# Patient Record
Sex: Male | Born: 1985 | Race: White | Hispanic: No | Marital: Single | Smoking: Current some day smoker
Health system: Southern US, Community
[De-identification: ages and names within clinical notes are randomized; demographics above are authoritative.]

---

## 2010-07-23 ENCOUNTER — Emergency Department: Payer: Self-pay | Admitting: Emergency Medicine

## 2011-10-17 ENCOUNTER — Emergency Department: Payer: Self-pay | Admitting: Emergency Medicine

## 2011-11-16 ENCOUNTER — Emergency Department: Payer: Self-pay | Admitting: Emergency Medicine
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
## 2021-05-26 ENCOUNTER — Other Ambulatory Visit: Payer: Self-pay

## 2021-05-26 ENCOUNTER — Emergency Department: Payer: Self-pay

## 2021-05-26 DIAGNOSIS — Z5321 Procedure and treatment not carried out due to patient leaving prior to being seen by health care provider: Secondary | ICD-10-CM | POA: Insufficient documentation

## 2021-05-26 DIAGNOSIS — S42002A Fracture of unspecified part of left clavicle, initial encounter for closed fracture: Secondary | ICD-10-CM | POA: Insufficient documentation

## 2021-05-26 DIAGNOSIS — S0990XA Unspecified injury of head, initial encounter: Secondary | ICD-10-CM | POA: Insufficient documentation

## 2021-05-26 MED ORDER — OXYCODONE HCL 5 MG PO TABS
5.0000 mg | ORAL_TABLET | Freq: Once | ORAL | Status: AC
  Administered 2021-05-26: 5 mg via ORAL
  Filled 2021-05-26: qty 1
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
## 2021-05-27 ENCOUNTER — Emergency Department
Admission: EM | Admit: 2021-05-27 | Discharge: 2021-05-27 | Disposition: A | Discharge status: Home / Self Care | Payer: Self-pay | Attending: Emergency Medicine | Admitting: Emergency Medicine

## 2022-01-02 ENCOUNTER — Other Ambulatory Visit: Payer: Self-pay

## 2022-01-02 ENCOUNTER — Emergency Department
Admission: EM | Admit: 2022-01-02 | Discharge: 2022-01-03 | Disposition: A | Discharge status: Other Acute Inpatient Hospital | Payer: Self-pay | Attending: Emergency Medicine | Admitting: Emergency Medicine

## 2022-01-02 DIAGNOSIS — Z20822 Contact with and (suspected) exposure to covid-19: Secondary | ICD-10-CM | POA: Insufficient documentation

## 2022-01-02 DIAGNOSIS — F1510 Other stimulant abuse, uncomplicated: Secondary | ICD-10-CM

## 2022-01-02 DIAGNOSIS — F209 Schizophrenia, unspecified: Secondary | ICD-10-CM

## 2022-01-02 DIAGNOSIS — Z046 Encounter for general psychiatric examination, requested by authority: Secondary | ICD-10-CM | POA: Insufficient documentation

## 2022-01-02 DIAGNOSIS — R45851 Suicidal ideations: Secondary | ICD-10-CM | POA: Insufficient documentation

## 2022-01-02 DIAGNOSIS — F4320 Adjustment disorder, unspecified: Secondary | ICD-10-CM | POA: Insufficient documentation

## 2022-01-02 DIAGNOSIS — R259 Unspecified abnormal involuntary movements: Secondary | ICD-10-CM | POA: Insufficient documentation

## 2022-01-02 LAB — CBC
HCT: 45.1 % (ref 39.0–52.0)
Hemoglobin: 15.3 g/dL (ref 13.0–17.0)
MCH: 29 pg (ref 26.0–34.0)
MCHC: 33.9 g/dL (ref 30.0–36.0)
MCV: 85.4 fL (ref 80.0–100.0)
Platelets: 300 10*3/uL (ref 150–400)
RBC: 5.28 MIL/uL (ref 4.22–5.81)
RDW: 12.9 % (ref 11.5–15.5)
WBC: 6.2 10*3/uL (ref 4.0–10.5)
nRBC: 0 % (ref 0.0–0.2)

## 2022-01-02 LAB — COMPREHENSIVE METABOLIC PANEL
ALT: 18 U/L (ref 0–44)
AST: 33 U/L (ref 15–41)
Albumin: 4.5 g/dL (ref 3.5–5.0)
Alkaline Phosphatase: 68 U/L (ref 38–126)
Anion gap: 8 (ref 5–15)
BUN: 25 mg/dL — ABNORMAL HIGH (ref 6–20)
CO2: 25 mmol/L (ref 22–32)
Calcium: 9.1 mg/dL (ref 8.9–10.3)
Chloride: 106 mmol/L (ref 98–111)
Creatinine, Ser: 1.11 mg/dL (ref 0.61–1.24)
GFR, Estimated: >60 mL/min (ref >60)
Glucose, Bld: 92 mg/dL (ref 70–99)
Potassium: 4.2 mmol/L (ref 3.5–5.1)
Sodium: 139 mmol/L (ref 135–145)
Total Bilirubin: 2.4 mg/dL — ABNORMAL HIGH (ref 0.3–1.2)
Total Protein: 7.5 g/dL (ref 6.5–8.1)

## 2022-01-02 LAB — URINE DRUG SCREEN, QUALITATIVE (ARMC ONLY)
Amphetamines, Ur Screen: POSITIVE — AB
Barbiturates, Ur Screen: NOT DETECTED
Benzodiazepine, Ur Scrn: NOT DETECTED
Cannabinoid 50 Ng, Ur ~~LOC~~: NOT DETECTED
Cocaine Metabolite,Ur ~~LOC~~: NOT DETECTED
MDMA (Ecstasy)Ur Screen: NOT DETECTED
Methadone Scn, Ur: NOT DETECTED
Opiate, Ur Screen: NOT DETECTED
Phencyclidine (PCP) Ur S: NOT DETECTED
Tricyclic, Ur Screen: NOT DETECTED

## 2022-01-02 LAB — RESP PANEL BY RT-PCR (FLU A&B, COVID) ARPGX2
Influenza A by PCR: NEGATIVE
Influenza B by PCR: NEGATIVE
SARS Coronavirus 2 by RT PCR: NEGATIVE

## 2022-01-02 LAB — ETHANOL: Alcohol, Ethyl (B): <10 mg/dL (ref <10)

## 2022-01-02 LAB — SALICYLATE LEVEL: Salicylate Lvl: <7 mg/dL — ABNORMAL LOW (ref 7.0–30.0)

## 2022-01-02 LAB — ACETAMINOPHEN LEVEL: Acetaminophen (Tylenol), Serum: <10 ug/mL — ABNORMAL LOW (ref 10–30)

## 2022-01-02 LAB — TROPONIN I (HIGH SENSITIVITY): Troponin I (High Sensitivity): 3 ng/L (ref <18)
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
## 2022-01-02 NOTE — ED Notes (Signed)
Patient Items:  Wallace Cullens?Orange tennis shoes [employer] t-shirt Gray/Red [employer] Socks 100$ Bill [employer]

## 2022-01-02 NOTE — BH Assessment (Signed)
Comprehensive Clinical Assessment (CCA) Note  01/02/2022 Brad ClanJeremy Corbitt [id number]  Chief Complaint:  Chief Complaint  Patient presents with   Suicidal   ivc   Visit Diagnosis: Adjustment Disorder   Brad Johns is a 36 year old male who presents to the ER after voicing SI with no specific plan, when he was taking to the jail. He was arrested for probation violation. While in booking, he said, if he loses his dogs he is going to end his life. The dogs are currently in the animal shelter. Patient further reports that he had the same thoughts in December but it was his dogs that kept him from following through with it. Patient states he has no support system, except his girlfriend but she lives in the home with her ex-husband and children. Patient denies HI and AV/H.  CCA Screening, Triage and Referral (STR)  Patient Reported Information How did you hear about us? No data recorded What Is the Reason for Your Visit/Call Today? Voice SI while in jail because he's afraid.  How Long Has This Been Causing You Problems? 1 wk - 1 month  What Do You Feel Would Help You the Most Today? Treatment for Depression or other mood problem   Have You Recently Had Any Thoughts About Hurting Yourself? Yes  Are You Planning to Commit Suicide/Harm Yourself At This time? No   Have you Recently Had Thoughts About Hurting Someone Karolee Ohslse? No  Are You Planning to Harm Someone at This Time? No  Explanation: No data recorded  Have You Used Any Alcohol or Drugs in the Past 24 Hours? No  How Long Ago Did You Use Drugs or Alcohol? No data recorded What Did You Use and How Much? No data recorded  Do You Currently Have a Therapist/Psychiatrist? No  Name of Therapist/Psychiatrist: No data recorded  Have You Been Recently Discharged From Any Office Practice or Programs? No  Explanation of Discharge From Practice/Program: No data recorded    CCA Screening Triage Referral Assessment Type of Contact:  Face-to-Face  Telemedicine Service Delivery:   Is this Initial or Reassessment? No data recorded Date Telepsych consult ordered in [facility]:  No data recorded Time Telepsych consult ordered in [facility]:  No data recorded [facility]  [facility]   Collateral Involvement: No data recorded  Does Patient Have a Court Appointed Legal Guardian? No data recorded Name and Contact of Legal Guardian: No data recorded If Minor and Not Living with Parent(s), Who has Custody? No data recorded Is CPS involved or ever been involved? Never  Is APS involved or ever been involved? Never   Patient Determined To Be At Risk for Harm To Self or Others Based on Review of Patient Reported Information or Presenting Complaint? No  Method: No data recorded Availability of Means: No data recorded Intent: No data recorded Notification Required: No data recorded Additional Information for Danger to Others Potential: No data recorded Additional Comments for Danger to Others Potential: No data recorded Are There Guns or Other Weapons in Your Home? No data recorded Types of Guns/Weapons: No data recorded Are These Weapons Safely Secured?                            No data recorded Who Could Verify You Are Able To Have These Secured: No data recorded Do You Have any Outstanding Charges, Pending Court Dates, Parole/Probation? No data recorded Contacted To Inform of Risk of Harm  To Self or Others: No data recorded   Does Patient Present under Involuntary Commitment? Yes  IVC Papers Initial File Date: 01/02/22   [place] of Residence: [place]   Patient Currently Receiving the Following Services: Not Receiving Services   Determination of Need: Emergent (2 hours)   Options For Referral: ED Visit     CCA Biopsychosocial Patient Reported Schizophrenia/Schizoaffective Diagnosis in Past: No   Strengths: Some insight   Mental Health Symptoms Depression:   Tearfulness;  Hopelessness   Duration of Depressive symptoms:  Duration of Depressive Symptoms: Less than two weeks   Mania:   N/A   Anxiety:    N/A   Psychosis:   None   Duration of Psychotic symptoms:    Trauma:   N/A   Obsessions:   N/A   Compulsions:   N/A   Inattention:   N/A   Hyperactivity/Impulsivity:   N/A   Oppositional/Defiant Behaviors:   N/A   Emotional Irregularity:   N/A   Other Mood/Personality Symptoms:  No data recorded   Mental Status Exam Appearance and self-care  Stature:   Average   Weight:   Average weight   Clothing:   Neat/clean; Age-appropriate   Grooming:   Normal   Cosmetic use:   None   Posture/gait:   Normal   Motor activity:   -- (Within normal range)   Sensorium  Attention:   Normal   Concentration:   Normal   Orientation:   X5   Recall/memory:   Normal   Affect and Mood  Affect:   Appropriate   Mood:   Depressed   Relating  Eye contact:   Staring   Facial expression:   Responsive   Attitude toward examiner:   Cooperative   Thought and Language  Speech flow:  Clear and Coherent   Thought content:   Appropriate to Mood and Circumstances   Preoccupation:   None   Hallucinations:   None   Organization:  No data recorded  [employer] of Knowledge:   Fair   Intelligence:   Average   Abstraction:   Normal   Judgement:   Fair   Dance movement psychotherapist:   Adequate   Insight:   Fair   Decision Making:   Normal   Social Functioning  Social Maturity:   Responsible   Social Judgement:   Normal; "Garment/textile technologist   Stress  Stressors:   Relationship; Legal   Coping Ability:   Normal   Skill Deficits:   None   Supports:   Support needed     Religion: Religion/Spirituality Are You A Religious Person?: No  Leisure/Recreation: Leisure / Recreation Do You Have Hobbies?: No  Exercise/Diet: Exercise/Diet Do You Exercise?: No Have You Gained or Lost A  Significant Amount of Weight in the Past Six Months?: No Do You Follow a Special Diet?: No Do You Have Any Trouble Sleeping?: No   CCA Employment/Education Employment/Work Situation: Employment / Work Situation Employment Situation: Unemployed Patient's Job has Been Impacted by Current Illness: No Has Patient ever Been in Equities trader?: No  Education: Education Is Patient Currently Attending School?: No Did Theme park manager?: No Did You Have An Individualized Education Program (IIEP): No Did You Have Any Difficulty At [employer]?: No Patient's Education Has Been Impacted by Current Illness: No   CCA Family/Childhood History Family and Relationship History: Family history Marital status: Single Does patient have children?: Yes How many children?: 2 How is patient's relationship with their children?: Strained  due the mother no allowing him to see them.  Childhood History:  Childhood History By whom was/is the patient raised?: Both parents Did patient suffer any verbal/emotional/physical/sexual abuse as a child?: No Did patient suffer from severe childhood neglect?: No Has patient ever been sexually abused/assaulted/raped as an adolescent or adult?: No Was the patient ever a victim of a crime or a disaster?: No Witnessed domestic violence?: No Has patient been affected by domestic violence as an adult?: No  Child/Adolescent Assessment:     CCA Substance Use Alcohol/Drug Use: Alcohol / Drug Use Pain Medications: See PTA Prescriptions: See PTA Over the Counter: See PTA History of alcohol / drug use?: No history of alcohol / drug abuse Longest period of sobriety (when/how long): n/a  ASAM's:  Six Dimensions of Multidimensional Assessment  Dimension 1:  Acute Intoxication and/or Withdrawal Potential:      Dimension 2:  Biomedical Conditions and Complications:      Dimension 3:  Emotional, Behavioral, or Cognitive Conditions and Complications:     Dimension 4:   Readiness to Change:     Dimension 5:  Relapse, Continued use, or Continued Problem Potential:     Dimension 6:  Recovery/Living Environment:     ASAM Severity Score:    ASAM Recommended Level of Treatment:     Substance use Disorder (SUD)    Recommendations for Services/Supports/Treatments:    Discharge Disposition:    DSM5 Diagnoses: There are no problems to display for this patient.   Referrals to Alternative Service(s): Referred to Alternative Service(s):   Place:   Date:   Time:    Referred to Alternative Service(s):   Place:   Date:   Time:    Referred to Alternative Service(s):   Place:   Date:   Time:    Referred to Alternative Service(s):   Place:   Date:   Time:     Lilyan Gilford MS, LCAS, [facility], [facility] Therapeutic Triage Specialist 01/02/2022 7:12 PM

## 2022-01-02 NOTE — ED Provider Notes (Signed)
[facility] Provider Note    Event Date/Time   First MD Initiated Contact with Patient 01/02/22 1714     (approximate)   History   Chief Complaint: Suicidal and ivc   HPI  Brad Johns is a 36 y.o. male who is brought to the ED under involuntary commitment by law enforcement due to expressing intent to kill himself because police took his pet dogs away and placed them in care of animal control.  He denies any current pain or injuries.  Reports he is in a complicated situation where his girlfriend is currently living with her children and her ex-husband, so he is not able to stay with her and is remaining homeless.  No HI, no VAH     Physical Exam   Triage Vital Signs: ED Triage Vitals  Enc Vitals Group     BP 01/02/22 1642 (!) 123/94     Pulse Rate 01/02/22 1642 (!) 104     Resp 01/02/22 1642 19     Temp 01/02/22 1642 98 F (36.7 C)     Temp Source 01/02/22 1642 Oral     SpO2 01/02/22 1642 99 %     Weight 01/02/22 1648 175 lb (79.4 kg)     Height 01/02/22 1648 5\' 6"  (1.676 m)     Head Circumference --      Peak Flow --      Pain Score 01/02/22 1647 7     Pain Loc --      Pain Edu? --      Excl. in GC? --     Most recent vital signs: Vitals:   01/02/22 1642  BP: (!) 123/94  Pulse: (!) 104  Resp: 19  Temp: 98 F (36.7 C)  SpO2: 99%    General: Awake, no distress.  CV:  Good peripheral perfusion.  Resp:  Normal effort.  Abd:  No distention.   ED Results / Procedures / Treatments   Labs (all labs ordered are listed, but only abnormal results are displayed) Labs Reviewed  COMPREHENSIVE METABOLIC PANEL - Abnormal; Notable for the following components:      Result Value   BUN 25 (*)    Total Bilirubin 2.4 (*)    All other components within normal limits  CBC  ETHANOL  SALICYLATE LEVEL  ACETAMINOPHEN LEVEL  URINE DRUG SCREEN, QUALITATIVE ([facility] ONLY)  TROPONIN I (HIGH SENSITIVITY)      EKG    RADIOLOGY    PROCEDURES:  Procedures   MEDICATIONS ORDERED IN ED: Medications - No data to display   IMPRESSION / MDM / ASSESSMENT AND PLAN / ED COURSE  I reviewed the triage vital signs and the nursing notes.                              Differential diagnosis includes, but is not limited to, suicidal ideation, adjustment disorder, major depression, stress/frustration  Patient's presentation is most consistent with acute presentation with potential threat to life or bodily function.  Patient brought to the ED for expressing suicidal intent in the setting of homelessness and patient's pet animals being removed from him.  He has no acute medical complaints, medically stable.  The patient has been placed in psychiatric observation due to the need to provide a safe environment for the patient while obtaining psychiatric consultation and evaluation, as well as ongoing medical and medication management to treat the patient's condition.  The  patient has been placed under full IVC at this time.        FINAL CLINICAL IMPRESSION(S) / ED DIAGNOSES   Final diagnoses:  Suicidal ideation     Rx / DC Orders   ED Discharge Orders     None        Note:  This document was prepared using Dragon voice recognition software and may include unintentional dictation errors.   Sharman Cheek, MD 01/02/22 [phone]

## 2022-01-02 NOTE — ED Notes (Addendum)
This tech attempted vitals on pt. Writer attempted to wake pt x3, pt would not awaken. Vitals not obtained at this time. Will attempt at a later time when pt is awake.

## 2022-01-02 NOTE — ED Notes (Signed)
Pt eating [place] sandwich tray with water

## 2022-01-02 NOTE — ED Triage Notes (Signed)
Pt states that he is homeless pt reports seeing a psychiatrist when he was younger with separation anxiety and depression, pt unaware if he has a diagnosis now. Pt's ivc paperwork states he said he would commit suicide if his dog's were taken away. Pt is making crying sounds and song sounds in triage. 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

## 2022-01-03 ENCOUNTER — Inpatient Hospital Stay
Admission: AD | Admit: 2022-01-03 | Discharge: 2022-01-06 | DRG: 885 | Disposition: A | Discharge status: Home / Self Care | Payer: 59 | Source: Intra-hospital | Attending: Psychiatry | Admitting: Psychiatry

## 2022-01-03 DIAGNOSIS — F203 Undifferentiated schizophrenia: Secondary | ICD-10-CM | POA: Diagnosis not present

## 2022-01-03 DIAGNOSIS — F15151 Other stimulant abuse with stimulant-induced psychotic disorder with hallucinations: Secondary | ICD-10-CM | POA: Diagnosis present

## 2022-01-03 DIAGNOSIS — R45851 Suicidal ideations: Secondary | ICD-10-CM | POA: Diagnosis present

## 2022-01-03 DIAGNOSIS — F209 Schizophrenia, unspecified: Principal | ICD-10-CM | POA: Diagnosis present

## 2022-01-03 DIAGNOSIS — F32A Depression, unspecified: Secondary | ICD-10-CM | POA: Diagnosis present

## 2022-01-03 DIAGNOSIS — Z5900 Homelessness unspecified: Secondary | ICD-10-CM

## 2022-01-03 DIAGNOSIS — F15951 Other stimulant use, unspecified with stimulant-induced psychotic disorder with hallucinations: Principal | ICD-10-CM

## 2022-01-03 DIAGNOSIS — F1510 Other stimulant abuse, uncomplicated: Secondary | ICD-10-CM

## 2022-01-03 MED ORDER — HYDROXYZINE HCL 50 MG PO TABS: 50.0000 mg | ORAL_TABLET | Freq: Three times a day (TID) | ORAL | Status: DC | PRN

## 2022-01-03 MED ORDER — ACETAMINOPHEN 325 MG PO TABS
650.0000 mg | ORAL_TABLET | Freq: Four times a day (QID) | ORAL | Status: DC | PRN
  Administered 2022-01-04: 650 mg via ORAL
  Filled 2022-01-03: qty 2

## 2022-01-03 MED ORDER — HALOPERIDOL 1 MG PO TABS
2.0000 mg | ORAL_TABLET | Freq: Two times a day (BID) | ORAL | Status: DC
  Administered 2022-01-04 – 2022-01-06 (×5): 2 mg via ORAL
  Filled 2022-01-03 (×5): qty 2

## 2022-01-03 MED ORDER — NICOTINE 21 MG/24HR TD PT24
21.0000 mg | MEDICATED_PATCH | Freq: Every day | TRANSDERMAL | Status: DC
  Administered 2022-01-04: 21 mg via TRANSDERMAL
  Filled 2022-01-03 (×3): qty 1

## 2022-01-03 MED ORDER — ALUM & MAG HYDROXIDE-SIMETH 200-200-20 MG/5ML PO SUSP: 30.0000 mL | ORAL | Status: DC | PRN

## 2022-01-03 MED ORDER — HALOPERIDOL 2 MG PO TABS
2.0000 mg | ORAL_TABLET | Freq: Two times a day (BID) | ORAL | Status: DC
  Administered 2022-01-03 (×2): 2 mg via ORAL
  Filled 2022-01-03 (×2): qty 1

## 2022-01-03 MED ORDER — TRAZODONE HCL 100 MG PO TABS: 100.0000 mg | ORAL_TABLET | Freq: Every evening | ORAL | Status: DC | PRN

## 2022-01-03 MED ORDER — MAGNESIUM HYDROXIDE 400 MG/5ML PO SUSP: 30.0000 mL | Freq: Every day | ORAL | Status: DC | PRN

## 2022-01-03 NOTE — ED Notes (Signed)
Report called and given to [facility], [place].

## 2022-01-03 NOTE — ED Notes (Signed)
IVC/pending inpatient psych admit  

## 2022-01-03 NOTE — ED Notes (Signed)
Patient ate100% of supper .

## 2022-01-03 NOTE — ED Notes (Signed)
Patient is cooperative, and calm, He is sitting up now eating lunch. Will continue to monitor.

## 2022-01-03 NOTE — Consult Note (Signed)
[facility]   Reason for Consult: Brief consult.  May note follow on admission.  36 year old man brought to the emergency room under IVC now reporting suicidal ideation and hallucinations Referring Physician: Cyril LoosenKinner Patient Identification: Brad ClanJeremy Apo MRN:  [id number] Principal Diagnosis: Schizophrenia (HCC) Diagnosis:  Principal Problem:   Schizophrenia (HCC) Active Problems:   Amphetamine abuse (HCC)   Total Time spent with patient: 45 minutes  Subjective:   Brad Johns is a 36 y.o. male patient admitted with I wanted to kill myself".  HPI: Reports he was just evicted from his [place] and violated on his probation and has been hearing voices and very depressed.  Thinking of suicide.  Using amphetamines.  Past Psychiatric History: Somewhat unclear.  Apparently has had past suicide attempts unclear if he is ever been actually treated for any mental illness  Risk to Self:   Risk to Others:   Prior Inpatient Therapy:   Prior Outpatient Therapy:    Past Medical History: No past medical history on file. No past surgical history on file. Family History: No family history on file. Family Psychiatric  History: An uncle with schizophrenia Social History:  Social History   Substance and Sexual Activity  Alcohol Use Not Currently     Social History   Substance and Sexual Activity  Drug Use Not on file    Social History   Socioeconomic History   Marital status: Single    Spouse name: Not on file   Number of children: Not on file   Years of education: Not on file   Highest education level: Not on file  Occupational History   Not on file  Tobacco Use   Smoking status: Some Days    Types: Cigarettes   Smokeless tobacco: Not on file  Substance and Sexual Activity   Alcohol use: Not Currently   Drug use: Not on file   Sexual activity: Not on file  Other Topics Concern   Not on file  Social History Narrative   Not on file   Social Determinants  of Health   Financial Resource Strain: Not on file  Food Insecurity: Not on file  Transportation Needs: Not on file  Physical Activity: Not on file  Stress: Not on file  Social Connections: Not on file   Additional Social History:    Allergies:  No Known Allergies  Labs:  Results for orders placed or performed during the hospital encounter of 01/02/22 (from the past 48 hour(s))  Comprehensive metabolic panel     Status: Abnormal   Collection Time: 01/02/22  4:43 PM  Result Value Ref Range   Sodium 139 135 - 145 mmol/L   Potassium 4.2 3.5 - 5.1 mmol/L   Chloride 106 98 - 111 mmol/L   CO2 25 22 - 32 mmol/L   Glucose, Bld 92 70 - 99 mg/dL    Comment: Glucose reference range applies only to samples taken after fasting for at least 8 hours.   BUN 25 (H) 6 - 20 mg/dL   Creatinine, Ser 4.091.11 0.61 - 1.24 mg/dL   Calcium 9.1 8.9 - 81.110.3 mg/dL   Total Protein 7.5 6.5 - 8.1 g/dL   Albumin 4.5 3.5 - 5.0 g/dL   AST 33 15 - 41 U/L   ALT 18 0 - 44 U/L   Alkaline Phosphatase 68 38 - 126 U/L   Total Bilirubin 2.4 (H) 0.3 - 1.2 mg/dL   GFR, Estimated >91>60 >47>60 mL/min    Comment: (NOTE) Calculated  using the CKD-EPI Creatinine Equation (2021)    Anion gap 8 5 - 15    Comment: Performed at [facility], [address] [place], [place] [address]  Ethanol     Status: None   Collection Time: 01/02/22  4:43 PM  Result Value Ref Range   Alcohol, Ethyl (B) <10 <10 mg/dL    Comment: (NOTE) Lowest detectable limit for serum alcohol is 10 mg/dL.  For medical purposes only. Performed at [facility], [address] [place], [place] [address]   Salicylate level     Status: Abnormal   Collection Time: 01/02/22  4:43 PM  Result Value Ref Range   Salicylate Lvl <7.0 (L) 7.0 - 30.0 mg/dL    Comment: Performed at [facility], [address] [place], [place] [address]  Acetaminophen level     Status: Abnormal   Collection Time: 01/02/22  4:43 PM  Result Value Ref  Range   Acetaminophen (Tylenol), Serum <10 (L) 10 - 30 ug/mL    Comment: (NOTE) Therapeutic concentrations vary significantly. A range of 10-30 ug/mL  may be an effective concentration for many patients. However, some  are best treated at concentrations outside of this range. Acetaminophen concentrations >150 ug/mL at 4 hours after ingestion  and >50 ug/mL at 12 hours after ingestion are often associated with  toxic reactions.  Performed at [facility], [address] [place], [place] [address]   cbc     Status: None   Collection Time: 01/02/22  4:43 PM  Result Value Ref Range   WBC 6.2 4.0 - 10.5 K/uL   RBC 5.28 4.22 - 5.81 MIL/uL   Hemoglobin 15.3 13.0 - 17.0 g/dL   HCT 87.5 64.3 - 32.9 %   MCV 85.4 80.0 - 100.0 fL   MCH 29.0 26.0 - 34.0 pg   MCHC 33.9 30.0 - 36.0 g/dL   RDW 51.8 84.1 - 66.0 %   Platelets 300 150 - 400 K/uL   nRBC 0.0 0.0 - 0.2 %    Comment: Performed at [facility], [address], [place], [place] [address]  Troponin I (High Sensitivity)     Status: None   Collection Time: 01/02/22  4:43 PM  Result Value Ref Range   Troponin I (High Sensitivity) 3 <18 ng/L    Comment: (NOTE) Elevated high sensitivity troponin I (hsTnI) values and significant  changes across serial measurements may suggest ACS but many other  chronic and acute conditions are known to elevate hsTnI results.  Refer to the "Links" section for chest pain algorithms and additional  guidance. Performed at [facility], [address] [place], [place] [address]   Urine Drug Screen, Qualitative     Status: Abnormal   Collection Time: 01/02/22  4:50 PM  Result Value Ref Range   Tricyclic, Ur Screen NONE DETECTED NONE DETECTED   Amphetamines, Ur Screen POSITIVE (A) NONE DETECTED   MDMA (Ecstasy)Ur Screen NONE DETECTED NONE DETECTED   Cocaine Metabolite,Ur [place] NONE DETECTED NONE DETECTED   Opiate, Ur Screen NONE DETECTED NONE DETECTED   Phencyclidine (PCP) Ur S  NONE DETECTED NONE DETECTED   Cannabinoid 50 Ng, Ur [place] NONE DETECTED NONE DETECTED   Barbiturates, Ur Screen NONE DETECTED NONE DETECTED   Benzodiazepine, Ur Scrn NONE DETECTED NONE DETECTED   Methadone Scn, Ur NONE DETECTED NONE DETECTED    Comment: (NOTE) Tricyclics + metabolites, urine    Cutoff 1000 ng/mL Amphetamines + metabolites, urine  Cutoff  1000 ng/mL MDMA (Ecstasy), urine              Cutoff 500 ng/mL Cocaine Metabolite, urine          Cutoff 300 ng/mL Opiate + metabolites, urine        Cutoff 300 ng/mL Phencyclidine (PCP), urine         Cutoff 25 ng/mL Cannabinoid, urine                 Cutoff 50 ng/mL Barbiturates + metabolites, urine  Cutoff 200 ng/mL Benzodiazepine, urine              Cutoff 200 ng/mL Methadone, urine                   Cutoff 300 ng/mL  The urine drug screen provides only a preliminary, unconfirmed analytical test result and should not be used for non-medical purposes. Clinical consideration and professional judgment should be applied to any positive drug screen result due to possible interfering substances. A more specific alternate chemical method must be used in order to obtain a confirmed analytical result. Gas chromatography / mass spectrometry (GC/MS) is the preferred confirm atory method. Performed at [facility], [address] [place], [place] [address]   Resp Panel by RT-PCR (Flu A&B, Covid) Anterior Nasal Swab     Status: None   Collection Time: 01/02/22  6:31 PM   Specimen: Anterior Nasal Swab  Result Value Ref Range   SARS Coronavirus 2 by RT PCR NEGATIVE NEGATIVE    Comment: (NOTE) SARS-CoV-2 target nucleic acids are NOT DETECTED.  The SARS-CoV-2 RNA is generally detectable in upper respiratory specimens during the acute phase of infection. The lowest concentration of SARS-CoV-2 viral copies this assay can detect is 138 copies/mL. A negative result does not preclude SARS-Cov-2 infection and should not be used as the  sole basis for treatment or other patient management decisions. A negative result may occur with  improper specimen collection/handling, submission of specimen other than nasopharyngeal swab, presence of viral mutation(s) within the areas targeted by this assay, and inadequate number of viral copies(<138 copies/mL). A negative result must be combined with clinical observations, patient history, and epidemiological information. The expected result is Negative.  Fact Sheet for Patients:  BloggerCourse.com  Fact Sheet for Healthcare Providers:  SeriousBroker.it  This test is no t yet approved or cleared by the [place] FDA and  has been authorized for detection and/or diagnosis of SARS-CoV-2 by FDA under an Emergency Use Authorization (EUA). This EUA will remain  in effect (meaning this test can be used) for the duration of the COVID-19 declaration under Section 564(b)(1) of the Act, 21 U.S.C.section 360bbb-3(b)(1), unless the authorization is terminated  or revoked sooner.       Influenza A by PCR NEGATIVE NEGATIVE   Influenza B by PCR NEGATIVE NEGATIVE    Comment: (NOTE) The Xpert Xpress SARS-CoV-2/FLU/RSV plus assay is intended as an aid in the diagnosis of influenza from Nasopharyngeal swab specimens and should not be used as a sole basis for treatment. Nasal washings and aspirates are unacceptable for Xpert Xpress SARS-CoV-2/FLU/RSV testing.  Fact Sheet for Patients: BloggerCourse.com  Fact Sheet for Healthcare Providers: SeriousBroker.it  This test is not yet approved or cleared by the [place] FDA and has been authorized for detection and/or diagnosis of SARS-CoV-2 by FDA under an Emergency Use Authorization (EUA). This EUA will remain in effect (meaning this test can be used) for the duration of the  COVID-19 declaration under Section 564(b)(1) of the Act, 21  U.S.C. section 360bbb-3(b)(1), unless the authorization is terminated or revoked.  Performed at [facility], [address] [place], [place] [address]     Current Facility-Administered Medications  Medication Dose Route Frequency Provider Last Rate Last Admin   haloperidol (HALDOL) tablet 2 mg  2 mg Oral BID Zyquan Crotty, Jackquline Denmark, MD       No current outpatient medications on file.    Musculoskeletal: Strength & Muscle Tone: within normal limits Gait & Station: normal Patient leans: N/A            Psychiatric Specialty Exam:  Presentation  General Appearance: No data recorded Eye Contact:No data recorded Speech:No data recorded Speech Volume:No data recorded Handedness:No data recorded  Mood and Affect  Mood:No data recorded Affect:No data recorded  Thought Process  Thought Processes:No data recorded Descriptions of Associations:No data recorded Orientation:No data recorded Thought Content:No data recorded History of Schizophrenia/Schizoaffective disorder:No  Duration of Psychotic Symptoms:No data recorded Hallucinations:No data recorded Ideas of Reference:No data recorded Suicidal Thoughts:No data recorded Homicidal Thoughts:No data recorded  Sensorium  Memory:No data recorded Judgment:No data recorded Insight:No data recorded  Executive Functions  Concentration:No data recorded Attention Span:No data recorded Recall:No data recorded Fund of Knowledge:No data recorded Language:No data recorded  Psychomotor Activity  Psychomotor Activity:No data recorded  Assets  Assets:No data recorded  Sleep  Sleep:No data recorded  Physical Exam: Physical Exam Vitals and nursing note reviewed.  Constitutional:      Appearance: Normal appearance.  HENT:     Head: Normocephalic and atraumatic.     Mouth/Throat:     Pharynx: Oropharynx is clear.  Eyes:     Pupils: Pupils are equal, round, and reactive to light.  Cardiovascular:     Rate  and Rhythm: Normal rate and regular rhythm.  Pulmonary:     Effort: Pulmonary effort is normal.     Breath sounds: Normal breath sounds.  Abdominal:     General: Abdomen is flat.     Palpations: Abdomen is soft.  Musculoskeletal:        General: Normal range of motion.  Skin:    General: Skin is warm and dry.  Neurological:     General: No focal deficit present.     Mental Status: He is alert. Mental status is at baseline.  Psychiatric:        Attention and Perception: He is inattentive. He perceives auditory hallucinations.        Mood and Affect: Mood is anxious and depressed. Affect is labile and inappropriate.        Speech: Speech is tangential.        Behavior: Behavior is agitated.        Thought Content: Thought content includes suicidal ideation.        Cognition and Memory: Cognition is impaired. Memory is impaired.        Judgment: Judgment is impulsive.   Review of Systems  Constitutional: Negative.   HENT: Negative.    Eyes: Negative.   Respiratory: Negative.    Cardiovascular: Negative.   Gastrointestinal: Negative.   Musculoskeletal: Negative.   Skin: Negative.   Neurological: Negative.   Psychiatric/Behavioral:  Positive for depression, hallucinations, substance abuse and suicidal ideas. The patient is nervous/anxious and has insomnia.   Blood pressure 96/66, pulse 78, temperature 98 F (36.7 C), temperature source Oral, resp. rate 16, height  (1.676 m), weight 79.4 kg, SpO2 99 %. Body mass  index is 28.25 kg/m.  Treatment Plan Summary: Medication management and Plan start antipsychotic medicine.  Continue IVC.  Recommend admission to the psychiatric unit  Disposition: Orders will be placed for admission  Mordecai Rasmussen, MD 01/03/2022 3:05 PM

## 2022-01-03 NOTE — ED Notes (Signed)
Pt given lunch tray and beverage 

## 2022-01-03 NOTE — ED Notes (Signed)
Pt given meal tray.

## 2022-01-03 NOTE — BH Assessment (Signed)
Patient is to be admitted to [facility] by Dr. Toni Amend.  Attending Physician will be Dr.  Toni Amend .   Patient has been assigned to room 320, by [facility] Charge Nurse [place].   Intake Paper Work has been signed and placed on patient chart.  ER staff is aware of the admission: Melody, ER Secretary   Dr. Toni Amend, ER MD  Alvis Lemmings, Patient's Nurse

## 2022-01-03 NOTE — ED Notes (Signed)
Pt taken to interview room with Dr. Weber Cooks

## 2022-01-03 NOTE — ED Notes (Signed)
Patient was given a meal tray and beverage

## 2022-01-04 ENCOUNTER — Other Ambulatory Visit: Payer: Self-pay

## 2022-01-04 ENCOUNTER — Encounter: Payer: Self-pay | Admitting: Psychiatry

## 2022-01-04 DIAGNOSIS — F203 Undifferentiated schizophrenia: Secondary | ICD-10-CM

## 2022-01-04 LAB — LIPID PANEL
Cholesterol: 161 mg/dL (ref 0–200)
HDL: 47 mg/dL (ref >40)
LDL Cholesterol: 101 mg/dL — ABNORMAL HIGH (ref 0–99)
Total CHOL/HDL Ratio: 3.4 RATIO
Triglycerides: 66 mg/dL (ref <150)
VLDL: 13 mg/dL (ref 0–40)

## 2022-01-04 LAB — HEMOGLOBIN A1C
Hgb A1c MFr Bld: 5.2 % (ref 4.8–5.6)
Mean Plasma Glucose: 102.54 mg/dL

## 2022-01-04 NOTE — Group Note (Signed)
LCSW Group Therapy Note  Group Date: 01/04/2022 Start Time: 1300 End Time: 1400   Type of Therapy and Topic:  Group Therapy - Healthy vs Unhealthy Coping Skills  Participation Level:  Did Not Attend   Description of Group The focus of this group was to determine what unhealthy coping techniques typically are used by group members and what healthy coping techniques would be helpful in coping with various problems. Patients were guided in becoming aware of the differences between healthy and unhealthy coping techniques. Patients were asked to identify 2-3 healthy coping skills they would like to learn to use more effectively.  Therapeutic Goals Patients learned that coping is what human beings do all day long to deal with various situations in their lives Patients defined and discussed healthy vs unhealthy coping techniques Patients identified their preferred coping techniques and identified whether these were healthy or unhealthy Patients determined 2-3 healthy coping skills they would like to become more familiar with and use more often. Patients provided support and ideas to each other   Summary of Patient Progress:Due to an influx of patients, group was not held on the unit.    Therapeutic Modalities Cognitive Behavioral Therapy Motivational Interviewing  Aizen Duval K Antion Andres, LCSWA 01/04/2022  3:35 PM   
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
## 2022-01-04 NOTE — Progress Notes (Signed)
[facility] MD Progress Note  01/04/2022 1:22 PM Brad Johns  MRN:  [id number] Subjective: Follow-up patient with mood symptoms and psychotic symptoms.  Patient is much calmer today but still somewhat grumpy.  He did get some rest.  He remains completely focused on wanting to get his dogs back.  He denies having any hallucinations today.  Denies suicidal thoughts. Principal Problem: Schizophrenia (HCC) Diagnosis: Principal Problem:   Schizophrenia (HCC)  Total Time spent with patient: 30 minutes  Past Psychiatric History: Past history unclear.  I still think there is a very good chance that he has schizophrenia although there may be some intellectual disability as well.  Certainly has had substance abuse issues at times.  Currently seems to have calm down the dangerousness but still not to have the best judgment  Past Medical History: History reviewed. No pertinent past medical history. History reviewed. No pertinent surgical history. Family History: History reviewed. No pertinent family history. Family Psychiatric  History: See previous Social History:  Social History   Substance and Sexual Activity  Alcohol Use Not Currently     Social History   Substance and Sexual Activity  Drug Use Not on file    Social History   Socioeconomic History   Marital status: Single    Spouse name: Not on file   Number of children: Not on file   Years of education: Not on file   Highest education level: Not on file  Occupational History   Not on file  Tobacco Use   Smoking status: Some Days    Types: Cigarettes   Smokeless tobacco: Not on file  Substance and Sexual Activity   Alcohol use: Not Currently   Drug use: Not on file   Sexual activity: Not on file  Other Topics Concern   Not on file  Social History Narrative   Not on file   Social Determinants of Health   Financial Resource Strain: Not on file  Food Insecurity: Not on file  Transportation Needs: Not on file  Physical Activity:  Not on file  Stress: Not on file  Social Connections: Not on file   Additional Social History:                         Sleep: Good  Appetite:  Fair  Current Medications: Current Facility-Administered Medications  Medication Dose Route Frequency Provider Last Rate Last Admin   acetaminophen (TYLENOL) tablet 650 mg  650 mg Oral Q6H PRN Omero Kowal, Jackquline Denmark, MD   650 mg at 01/04/22 0820   alum & mag hydroxide-simeth (MAALOX/MYLANTA) 200-200-20 MG/5ML suspension 30 mL  30 mL Oral Q4H PRN Katheren Jimmerson, Jackquline Denmark, MD       haloperidol (HALDOL) tablet 2 mg  2 mg Oral BID Colena Ketterman T, MD   2 mg at 01/04/22 0818   hydrOXYzine (ATARAX) tablet 50 mg  50 mg Oral TID PRN Brodie Scovell, Jackquline Denmark, MD       magnesium hydroxide (MILK OF MAGNESIA) suspension 30 mL  30 mL Oral Daily PRN Jerianne Anselmo T, MD       nicotine (NICODERM CQ - dosed in mg/24 hours) patch 21 mg  21 mg Transdermal Q0600 Ginger Leeth T, MD   21 mg at 01/04/22 0818   traZODone (DESYREL) tablet 100 mg  100 mg Oral QHS PRN Lolamae Voisin, Jackquline Denmark, MD        Lab Results:  Results for orders placed or performed during the hospital encounter of  01/03/22 (from the past 48 hour(s))  Lipid panel     Status: Abnormal   Collection Time: 01/04/22  6:15 AM  Result Value Ref Range   Cholesterol 161 0 - 200 mg/dL   Triglycerides 66 <409<150 mg/dL   HDL 47 >81>40 mg/dL   Total CHOL/HDL Ratio 3.4 RATIO   VLDL 13 0 - 40 mg/dL   LDL Cholesterol 191101 (H) 0 - 99 mg/dL    Comment:        Total Cholesterol/HDL:CHD Risk Coronary Heart Disease Risk Table                     Men   Women  1/2 Average Risk   3.4   3.3  Average Risk       5.0   4.4  2 X Average Risk   9.6   7.1  3 X Average Risk  23.4   11.0        Use the calculated Patient Ratio above and the CHD Risk Table to determine the patient's CHD Risk.        ATP III CLASSIFICATION (LDL):  <100     mg/dL   Optimal  [id number]  mg/dL   Near or Above                    Optimal  130-159  mg/dL   Borderline   [id number]  mg/dL   High  >657>190     mg/dL   Very High Performed at [facility], [address] [place], [place] [address]     Blood Alcohol level:  Lab Results  Component Value Date   [facility] 01/02/2022    Metabolic Disorder Labs: No results found for: HGBA1C, MPG No results found for: PROLACTIN Lab Results  Component Value Date   CHOL 161 01/04/2022   TRIG 66 01/04/2022   HDL 47 01/04/2022   CHOLHDL 3.4 01/04/2022   VLDL 13 01/04/2022   LDLCALC 101 (H) 01/04/2022    Physical Findings: AIMS:  , ,  ,  ,    CIWA:    COWS:     Musculoskeletal: Strength & Muscle Tone: within normal limits Gait & Station: normal Patient leans: N/A  Psychiatric Specialty Exam:  Presentation  General Appearance: No data recorded Eye Contact:No data recorded Speech:No data recorded Speech Volume:No data recorded Handedness:No data recorded  Mood and Affect  Mood:No data recorded Affect:No data recorded  Thought Process  Thought Processes:No data recorded Descriptions of Associations:No data recorded Orientation:No data recorded Thought Content:No data recorded History of Schizophrenia/Schizoaffective disorder:No  Duration of Psychotic Symptoms:No data recorded Hallucinations:No data recorded Ideas of Reference:No data recorded Suicidal Thoughts:No data recorded Homicidal Thoughts:No data recorded  Sensorium  Memory:No data recorded Judgment:No data recorded Insight:No data recorded  Executive Functions  Concentration:No data recorded Attention Span:No data recorded Recall:No data recorded Fund of Knowledge:No data recorded Language:No data recorded  Psychomotor Activity  Psychomotor Activity:No data recorded  Assets  Assets:No data recorded  Sleep  Sleep:No data recorded   Physical Exam: Physical Exam Vitals and nursing note reviewed.  Constitutional:      Appearance: Normal appearance.  HENT:     Head: Normocephalic and atraumatic.      Mouth/Throat:     Pharynx: Oropharynx is clear.  Eyes:     Pupils: Pupils are equal, round, and reactive to light.  Cardiovascular:     Rate and Rhythm: Normal rate and regular rhythm.  Pulmonary:  Effort: Pulmonary effort is normal.     Breath sounds: Normal breath sounds.  Abdominal:     General: Abdomen is flat.     Palpations: Abdomen is soft.  Musculoskeletal:        General: Normal range of motion.  Skin:    General: Skin is warm and dry.  Neurological:     General: No focal deficit present.     Mental Status: He is alert. Mental status is at baseline.  Psychiatric:        Attention and Perception: He is inattentive.        Mood and Affect: Mood normal. Affect is blunt.        Speech: Speech is delayed.        Behavior: Behavior is slowed.        Thought Content: Thought content normal.   Review of Systems  Constitutional: Negative.   HENT: Negative.    Eyes: Negative.   Respiratory: Negative.    Cardiovascular: Negative.   Gastrointestinal: Negative.   Musculoskeletal: Negative.   Skin: Negative.   Neurological: Negative.   Psychiatric/Behavioral:  Positive for depression. Negative for hallucinations and suicidal ideas.   Blood pressure 117/79, pulse 85, temperature 97.8 F (36.6 C), temperature source Oral, resp. rate 18, height 5\' 6"  (1.676 m), weight 73.5 kg, SpO2 98 %. Body mass index is 26.15 kg/m.   Treatment Plan Summary: Medication management and Plan no change to current medicine.  Encourage patient to be up out of bed and go outdoors interact with staff and social work.  Reassess tomorrow  , MD 01/04/2022, 1:22 PM

## 2022-01-04 NOTE — Plan of Care (Signed)
Patient newly admitted and he requires management and stabilization of psychiatric symptoms.

## 2022-01-04 NOTE — [facility] Counselor (Signed)
CSW met 1:1 with patient to request written consent to speak with a collateral contact to complete suicide prevention education and obtain a ROI for discharge planning. Patient requested to sign paperwork at a later time due to feeling "fuzzy" from "the medicine they have me on." CSW to follow up.   Signed,  Berniece Salines, MSW, LCSWA, LCASA 01/04/2022 3:23 PM

## 2022-01-04 NOTE — Plan of Care (Signed)
D: Pt alert and oriented. Pt reports experiencing depression at this time r/t wanting to get his dogs back and knowing they are okay.  Pt denies experiencing any pain at this time. Pt denies experiencing any SI/HI, or AVH at this time.   A: Scheduled medications administered to pt, per MD orders. Support and encouragement provided. Frequent verbal contact made. Routine safety checks conducted q15 minutes.   R: No adverse drug reactions noted. Pt verbally contracts for safety at this time. Pt compliant with medications. Pt interacts minimally with others on the unit. Pt remains safe at this time. Will continue to monitor.   Problem: Health Behavior/Discharge Planning: Goal: Compliance with therapeutic regimen will improve Outcome: Progressing   Problem: Education: Goal: Utilization of techniques to improve thought processes will improve Outcome: Not Progressing   Problem: Activity: Goal: Interest or engagement in leisure activities will improve Outcome: Not Progressing   Problem: Safety: Goal: Ability to identify and utilize support systems that promote safety will improve Outcome: Not Progressing

## 2022-01-04 NOTE — Plan of Care (Signed)
D: Pt alert and oriented. Pt rates depression 10/10, hopelessness 5/10, and anxiety 5/10. Pt reports energy level as low and concentration as being good. Pt reports sleep last night as being fair. Pt did not receive medications for sleep. Pt reports experiencing back pain at this time, prn meds given. Pt denies experiencing any SI/HI, or AVH at this time.   A: Scheduled medications administered to pt, per MD orders. Support and encouragement provided. Frequent verbal contact made. Routine safety checks conducted q15 minutes.   R: No adverse drug reactions noted. Pt verbally contracts for safety at this time. Pt compliant with medications and treatment plan. Pt interacts minimally with others on the unit. Pt remains safe at this time. Will continue to monitor.   Problem: Health Behavior/Discharge Planning: Goal: Compliance with therapeutic regimen will improve Outcome: Progressing   Problem: Safety: Goal: Ability to identify and utilize support systems that promote safety will improve Outcome: Not Progressing

## 2022-01-05 ENCOUNTER — Other Ambulatory Visit: Payer: Self-pay

## 2022-01-05 DIAGNOSIS — F203 Undifferentiated schizophrenia: Secondary | ICD-10-CM | POA: Diagnosis not present

## 2022-01-05 MED ORDER — TRAZODONE HCL 100 MG PO TABS
100.0000 mg | ORAL_TABLET | Freq: Every evening | ORAL | 0 refills | Status: DC | PRN
  Filled 2022-01-05: qty 30, 30d supply, fill #0

## 2022-01-05 MED ORDER — HALOPERIDOL 2 MG PO TABS
2.0000 mg | ORAL_TABLET | Freq: Two times a day (BID) | ORAL | 0 refills | Status: DC
  Filled 2022-01-05: qty 60, 30d supply, fill #0

## 2022-01-05 MED ORDER — NICOTINE 21 MG/24HR TD PT24
21.0000 mg | MEDICATED_PATCH | Freq: Every day | TRANSDERMAL | 0 refills | Status: DC
  Filled 2022-01-05: qty 28, 28d supply, fill #0

## 2022-01-05 NOTE — Plan of Care (Signed)
D- Patient alert and oriented. Patient presents in a preoccupied, but pleasant mood on assessment stating that he slept alright last night and had no complaints to voice to this Probation officer. Patient denies anxiety, but endorsed depression, rating it a "5/10", stating "I want to get out of here to go see my dogs". Patient also denies SI, HI, AVH, and pain at this time. Patient is just fixated on his dogs. Patient has no stated goals for today.   A- Scheduled medications administered to patient, per MD orders. Support and encouragement provided.  Routine safety checks conducted every 15 minutes.  Patient informed to notify staff with problems or concerns.  R- No adverse drug reactions noted. Patient contracts for safety at this time. Patient compliant with medications. Patient receptive, calm, and cooperative. Patient remains safe at this time.  Problem: Education: Goal: Utilization of techniques to improve thought processes will improve Outcome: Progressing Goal: Knowledge of the prescribed therapeutic regimen will improve Outcome: Progressing   Problem: Activity: Goal: Interest or engagement in leisure activities will improve Outcome: Progressing Goal: Imbalance in normal sleep/wake cycle will improve Outcome: Progressing   Problem: Coping: Goal: Coping ability will improve Outcome: Progressing Goal: Will verbalize feelings Outcome: Progressing   Problem: Health Behavior/Discharge Planning: Goal: Ability to make decisions will improve Outcome: Progressing Goal: Compliance with therapeutic regimen will improve Outcome: Progressing   Problem: Role Relationship: Goal: Will demonstrate positive changes in social behaviors and relationships Outcome: Progressing   Problem: Safety: Goal: Ability to disclose and discuss suicidal ideas will improve Outcome: Progressing Goal: Ability to identify and utilize support systems that promote safety will improve Outcome: Progressing   Problem:  Self-Concept: Goal: Will verbalize positive feelings about self Outcome: Progressing Goal: Level of anxiety will decrease Outcome: Progressing

## 2022-01-05 NOTE — Progress Notes (Signed)
[facility] MD Progress Note  01/05/2022 11:30 AM Brad Johns  MRN:  [id number] Subjective: Follow-up for this 36 year old man who came in with agitation and mood instability psychotic symptoms.  Patient seen today says he is feeling better.  Denies hallucinations today.  Denies suicidal thoughts.  Much calmer and more rational and taking care of himself better today. Principal Problem: Schizophrenia (HCC) Diagnosis: Principal Problem:   Schizophrenia (HCC)  Total Time spent with patient: 30 minutes  Past Psychiatric History: Past history of what sounds like substance abuse probably as well as chronic hallucinations and mood instability  Past Medical History: History reviewed. No pertinent past medical history. History reviewed. No pertinent surgical history. Family History: History reviewed. No pertinent family history. Family Psychiatric  History: See previous Social History:  Social History   Substance and Sexual Activity  Alcohol Use Not Currently     Social History   Substance and Sexual Activity  Drug Use Not on file    Social History   Socioeconomic History   Marital status: Single    Spouse name: Not on file   Number of children: Not on file   Years of education: Not on file   Highest education level: Not on file  Occupational History   Not on file  Tobacco Use   Smoking status: Some Days    Types: Cigarettes   Smokeless tobacco: Not on file  Substance and Sexual Activity   Alcohol use: Not Currently   Drug use: Not on file   Sexual activity: Not on file  Other Topics Concern   Not on file  Social History Narrative   Not on file   Social Determinants of Health   Financial Resource Strain: Not on file  Food Insecurity: Not on file  Transportation Needs: Not on file  Physical Activity: Not on file  Stress: Not on file  Social Connections: Not on file   Additional Social History:                         Sleep: Fair  Appetite:  Good  Current  Medications: Current Facility-Administered Medications  Medication Dose Route Frequency Provider Last Rate Last Admin   acetaminophen (TYLENOL) tablet 650 mg  650 mg Oral Q6H PRN Carime Dinkel, Jackquline Denmark, MD   650 mg at 01/04/22 0820   alum & mag hydroxide-simeth (MAALOX/MYLANTA) 200-200-20 MG/5ML suspension 30 mL  30 mL Oral Q4H PRN Deion Forgue, Jackquline Denmark, MD       haloperidol (HALDOL) tablet 2 mg  2 mg Oral BID Asyah Candler T, MD   2 mg at 01/05/22 9833   hydrOXYzine (ATARAX) tablet 50 mg  50 mg Oral TID PRN Geovanny Sartin, Jackquline Denmark, MD       magnesium hydroxide (MILK OF MAGNESIA) suspension 30 mL  30 mL Oral Daily PRN Taleisha Kaczynski T, MD       nicotine (NICODERM CQ - dosed in mg/24 hours) patch 21 mg  21 mg Transdermal Q0600 Ravenna Legore T, MD   21 mg at 01/04/22 0818   traZODone (DESYREL) tablet 100 mg  100 mg Oral QHS PRN Karolyn Messing, Jackquline Denmark, MD        Lab Results:  Results for orders placed or performed during the hospital encounter of 01/03/22 (from the past 48 hour(s))  Hemoglobin A1c     Status: None   Collection Time: 01/04/22  6:15 AM  Result Value Ref Range   Hgb A1c MFr Bld 5.2 4.8 -  5.6 %    Comment: (NOTE) Pre diabetes:          5.7%-6.4%  Diabetes:              >6.4%  Glycemic control for   <7.0% adults with diabetes    Mean Plasma Glucose 102.54 mg/dL    Comment: Performed at [facility], [address] [place], [place] [address]  Lipid panel     Status: Abnormal   Collection Time: 01/04/22  6:15 AM  Result Value Ref Range   Cholesterol 161 0 - 200 mg/dL   Triglycerides 66 <914<150 mg/dL   HDL 47 >78>40 mg/dL   Total CHOL/HDL Ratio 3.4 RATIO   VLDL 13 0 - 40 mg/dL   LDL Cholesterol 295101 (H) 0 - 99 mg/dL    Comment:        Total Cholesterol/HDL:CHD Risk Coronary Heart Disease Risk Table                     Men   Women  1/2 Average Risk   3.4   3.3  Average Risk       5.0   4.4  2 X Average Risk   9.6   7.1  3 X Average Risk  23.4   11.0        Use the calculated Patient  Ratio above and the CHD Risk Table to determine the patient's CHD Risk.        ATP III CLASSIFICATION (LDL):  <100     mg/dL   Optimal  621-308100-129  mg/dL   Near or Above                    Optimal  130-159  mg/dL   Borderline  [id number]  mg/dL   High  >962>190     mg/dL   Very High Performed at [facility], [address] [place], [place] [address]     Blood Alcohol level:  Lab Results  Component Value Date   [facility]    Metabolic Disorder Labs: Lab Results  Component Value Date   HGBA1C 5.2 01/04/2022   MPG 102.54 01/04/2022   No results found for: PROLACTIN Lab Results  Component Value Date   CHOL 161 01/04/2022   TRIG 66 01/04/2022   HDL 47 01/04/2022   CHOLHDL 3.4 01/04/2022   VLDL 13 01/04/2022   LDLCALC 101 (H) 01/04/2022    Physical Findings: AIMS:  , ,  ,  ,    CIWA:    COWS:     Musculoskeletal: Strength & Muscle Tone: within normal limits Gait & Station: normal Patient leans: N/A  Psychiatric Specialty Exam:  Presentation  General Appearance: No data recorded Eye Contact:No data recorded Speech:No data recorded Speech Volume:No data recorded Handedness:No data recorded  Mood and Affect  Mood:No data recorded Affect:No data recorded  Thought Process  Thought Processes:No data recorded Descriptions of Associations:No data recorded Orientation:No data recorded Thought Content:No data recorded History of Schizophrenia/Schizoaffective disorder:No  Duration of Psychotic Symptoms:No data recorded Hallucinations:No data recorded Ideas of Reference:No data recorded Suicidal Thoughts:No data recorded Homicidal Thoughts:No data recorded  Sensorium  Memory:No data recorded Judgment:No data recorded Insight:No data recorded  Executive Functions  Concentration:No data recorded Attention Span:No data recorded Recall:No data recorded Fund of Knowledge:No data recorded Language:No data recorded  Psychomotor Activity   Psychomotor Activity:No data recorded  Assets  Assets:No data recorded  Sleep  Sleep:No data recorded  Physical Exam: Physical Exam Vitals and nursing note reviewed.  Constitutional:      Appearance: Normal appearance.  HENT:     Head: Normocephalic and atraumatic.     Mouth/Throat:     Pharynx: Oropharynx is clear.  Eyes:     Pupils: Pupils are equal, round, and reactive to light.  Cardiovascular:     Rate and Rhythm: Normal rate and regular rhythm.  Pulmonary:     Effort: Pulmonary effort is normal.     Breath sounds: Normal breath sounds.  Abdominal:     General: Abdomen is flat.     Palpations: Abdomen is soft.  Musculoskeletal:        General: Normal range of motion.  Skin:    General: Skin is warm and dry.  Neurological:     General: No focal deficit present.     Mental Status: He is alert. Mental status is at baseline.  Psychiatric:        Mood and Affect: Mood normal.        Thought Content: Thought content normal.   Review of Systems  Constitutional: Negative.   HENT: Negative.    Eyes: Negative.   Respiratory: Negative.    Cardiovascular: Negative.   Gastrointestinal: Negative.   Musculoskeletal: Negative.   Skin: Negative.   Neurological: Negative.   Psychiatric/Behavioral: Negative.    Blood pressure 95/70, pulse 95, temperature 98.3 F (36.8 C), temperature source Oral, resp. rate 16, height 5\' 6"  (1.676 m), weight 73.5 kg, SpO2 98 %. Body mass index is 26.15 kg/m.   Treatment Plan Summary: Medication management and Plan no change to medication management.  Encouraged patient to be up today to talk to staff talk to social worker think about what his needs will be for treatment going forward.  Most likely we will be looking at discharge tomorrow.  , MD 01/05/2022, 11:30 AM

## 2022-01-05 NOTE — Progress Notes (Signed)
When this writer went to get patient for evening med pass, he asked this Clinical research associate if he has to take it. This Clinical research associate explained to patient that he has the right to refuse medication, but it would not help him get discharged. Patient then stated, "ok, I'll take it, he said I'm leaving tomorrow morning". Patient tolerated medication administration well, without any issues. Patient remains safe.

## 2022-01-06 ENCOUNTER — Other Ambulatory Visit: Payer: Self-pay

## 2022-01-06 DIAGNOSIS — F15951 Other stimulant use, unspecified with stimulant-induced psychotic disorder with hallucinations: Principal | ICD-10-CM

## 2022-01-06 DIAGNOSIS — F203 Undifferentiated schizophrenia: Secondary | ICD-10-CM | POA: Diagnosis not present

## 2022-01-06 MED ORDER — NICOTINE 21 MG/24HR TD PT24: 21.0000 mg | MEDICATED_PATCH | Freq: Every day | TRANSDERMAL | 0 refills | Status: AC

## 2022-01-06 MED ORDER — HALOPERIDOL 2 MG PO TABS: 2.0000 mg | ORAL_TABLET | Freq: Two times a day (BID) | ORAL | 1 refills | Status: AC

## 2022-01-06 MED ORDER — TRAZODONE HCL 100 MG PO TABS: 100.0000 mg | ORAL_TABLET | Freq: Every evening | ORAL | 1 refills | Status: AC | PRN

## 2022-01-06 NOTE — Discharge Summary (Signed)
Physician Discharge Summary Note  Patient:  Brad Johns is an 36 y.o., male MRN:  [id number] DOB:  1985/11/29 Patient phone:  [phone] (home)  Patient address:   [address] [place] [address],  Total Time spent with patient: 30 minutes  Date of Admission:  01/03/2022 Date of Discharge: 01/06/2022  Reason for Admission: Admitted after presentation to the emergency room with agitated disorganized behavior paranoid thinking reports of hallucinations and extreme mood lability.  Patient was reporting suicidal ideation when he first came in.  Principal Problem: Amphetamine and psychostimulant-induced psychosis with hallucinations [facility]) Discharge Diagnoses: Principal Problem:   Yes changing meds.  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  Unclear if he has had a previous diagnosis.  Appears to have a history of ongoing substance abuse  Past Medical History: History reviewed. No pertinent past medical history. History reviewed. No pertinent surgical history. Family History: History reviewed. No pertinent family history. Family Psychiatric  History: No information Social History:  Social History   Substance and Sexual Activity  Alcohol Use Not Currently     Social History   Substance and Sexual Activity  Drug Use Not on file    Social History   Socioeconomic History   Marital status: Single    Spouse name: Not on file   Number of children: Not on file   Years of education: Not on file   Highest education level: Not on file  Occupational History   Not on file  Tobacco Use   Smoking status: Some Days    Types: Cigarettes   Smokeless tobacco: Not on file  Substance and Sexual Activity   Alcohol use: Not Currently   Drug use: Not on file   Sexual activity: Not on file  Other Topics Concern   Not on file  Social History Narrative   Not on file   Social Determinants of Health   Financial  Resource Strain: Not on file  Food Insecurity: Not on file  Transportation Needs: Not on file  Physical Activity: Not on file  Stress: Not on file  Social Connections: Not on file    Hospital Course: Admitted to the psychiatric unit.  15-minute checks continued.  Patient did not display any dangerous violent or aggressive behavior while on the unit.  He did not engage in any violence and did not show any tendency to hurt himself.  He was cooperative with medication and treatment plan.  In the last day his mood is improved and stabilized.  He now reports that he is feeling much better.  Denies sadness or depression.  Denies any hallucinations.  Does not appear to be responding to internal stimuli.  I have left the psychotic diagnosis but adjusted it to reflect what I think is the more likely underlying problem of amphetamine abuse.  Patient will be discharged with current medication and strongly encouraged to stay away from substance abuse and to follow up with outpatient treatment with referral to [facility].  Physical Findings: AIMS:  , ,  ,  ,    CIWA:    COWS:     Musculoskeletal: Strength & Muscle Tone: within normal limits Gait & Station: normal Patient leans: N/A   Psychiatric Specialty Exam:  Presentation  General Appearance: No data recorded Eye Contact:No data recorded Speech:No data recorded Speech Volume:No data recorded Handedness:No data recorded  Mood and Affect  Mood:No data recorded Affect:No data recorded  Thought Process  Thought  Processes:No data recorded Descriptions of Associations:No data recorded Orientation:No data recorded Thought Content:No data recorded History of Schizophrenia/Schizoaffective disorder:No  Duration of Psychotic Symptoms:No data recorded Hallucinations:No data recorded Ideas of Reference:No data recorded Suicidal Thoughts:No data recorded Homicidal Thoughts:No data recorded  Sensorium  Memory:No data recorded Judgment:No data  recorded Insight:No data recorded  Executive Functions  Concentration:No data recorded Attention Span:No data recorded Recall:No data recorded Fund of Knowledge:No data recorded Language:No data recorded  Psychomotor Activity  Psychomotor Activity:No data recorded  Assets  Assets:No data recorded  Sleep  Sleep:No data recorded   Physical Exam: Physical Exam Vitals and nursing note reviewed.  Constitutional:      Appearance: Normal appearance.  HENT:     Head: Normocephalic and atraumatic.     Mouth/Throat:     Pharynx: Oropharynx is clear.  Eyes:     Pupils: Pupils are equal, round, and reactive to light.  Cardiovascular:     Rate and Rhythm: Normal rate and regular rhythm.  Pulmonary:     Effort: Pulmonary effort is normal.     Breath sounds: Normal breath sounds.  Abdominal:     General: Abdomen is flat.     Palpations: Abdomen is soft.  Musculoskeletal:        General: Normal range of motion.  Skin:    General: Skin is warm and dry.  Neurological:     General: No focal deficit present.     Mental Status: He is alert. Mental status is at baseline.  Psychiatric:        Attention and Perception: Attention normal.        Mood and Affect: Mood normal.        Speech: Speech normal.        Behavior: Behavior normal.        Thought Content: Thought content normal.        Cognition and Memory: Cognition normal.        Judgment: Judgment normal.   Review of Systems  Constitutional: Negative.   HENT: Negative.    Eyes: Negative.   Respiratory: Negative.    Cardiovascular: Negative.   Gastrointestinal: Negative.   Musculoskeletal: Negative.   Skin: Negative.   Neurological: Negative.   Psychiatric/Behavioral: Negative.    Blood pressure 105/75, pulse 88, temperature 98.2 F (36.8 C), temperature source Oral, resp. rate 18, height 5\' 6"  (1.676 m), weight 73.5 kg, SpO2 97 %. Body mass index is 26.15 kg/m.   Social History   Tobacco Use  Smoking Status  Some Days   Types: Cigarettes  Smokeless Tobacco Not on file   Tobacco Cessation:  A prescription for an FDA-approved tobacco cessation medication provided at discharge   Blood Alcohol level:  Lab Results  Component Value Date   ETH <10 01/02/2022    Metabolic Disorder Labs:  Lab Results  Component Value Date   HGBA1C 5.2 01/04/2022   MPG 102.54 01/04/2022   No results found for: PROLACTIN Lab Results  Component Value Date   CHOL 161 01/04/2022   TRIG 66 01/04/2022   HDL 47 01/04/2022   CHOLHDL 3.4 01/04/2022   VLDL 13 01/04/2022   LDLCALC 101 (H) 01/04/2022    See Psychiatric Specialty Exam and Suicide Risk Assessment completed by Attending Physician prior to discharge.  Discharge destination:  Home  Is patient on multiple antipsychotic therapies at discharge:  No   Has Patient had three or more failed trials of antipsychotic monotherapy by history:  No  Recommended Plan for Multiple  Antipsychotic Therapies: NA  Discharge Instructions     Diet - low sodium heart healthy   Complete by: As directed    Increase activity slowly   Complete by: As directed       Allergies as of 01/06/2022   No Known Allergies      Medication List     TAKE these medications      Indication  haloperidol 2 MG tablet Commonly known as: HALDOL Take 1 tablet (2 mg total) by mouth 2 (two) times daily.  Indication: Psychosis   nicotine 21 mg/24hr patch Commonly known as: NICODERM CQ - dosed in mg/24 hours Place 1 patch (21 mg total) onto the skin daily at 6 (six) AM.  Indication: Nicotine Addiction   traZODone 100 MG tablet Commonly known as: DESYREL Take 1 tablet (100 mg total) by mouth at bedtime as needed for sleep.  Indication: Trouble Sleeping         Follow-up recommendations: Recommend follow-up treatment with [facility] for substance abuse and mental health services.  Comments: 7-day supply and prescriptions have been provided.  Signed: Mordecai RasmussenJohn Beulah Capobianco,  MD 01/06/2022, 10:32 AM

## 2022-01-06 NOTE — [facility] Suicide Risk Assessment (Signed)
[facility] INPATIENT:  Family/Significant Other Suicide Prevention Education  Suicide Prevention Education:  Contact Attempts: Brad Johns, girlfriend, [phone] has been identified by the patient as the family member/significant other with whom the patient will be residing, and identified as the person(s) who will aid the patient in the event of a mental health crisis.  With written consent from the patient, two attempts were made to provide suicide prevention education, prior to and/or following the patient's discharge.  We were unsuccessful in providing suicide prevention education.  A suicide education pamphlet was given to the patient to share with family/significant other.  Date and time of first attempt: 01/06/2022 11:42 AM  Date and time of second attempt: Second attempt is needed.  CSW left HIPAA compliant voicemail.   Harden Mo 01/06/2022, 11:42 AM

## 2022-01-06 NOTE — Progress Notes (Signed)
  [facility] Adult Case Management Discharge Plan :  Will you be returning to the same living situation after discharge:  Yes,  pt reports that he is going to a friends home. At discharge, do you have transportation home?: Yes,  pt requested to catch the bus. Do you have the ability to pay for your medications: No.  Release of information consent forms completed and in the chart;  Patient's signature needed at discharge.  Patient to Follow up at:  Follow-up Information     [facility] Follow up.   Why: Lorella Nimrod, peer support specialist, will be picking you up from your home on Friday, 01/10/22 at 7am. Thanks! Contact information: [address] [place] [address] [phone]                 Next level of care provider has access to [facility] Link:no  Safety Planning and Suicide Prevention discussed: Yes,  SPE completed with the patient.  Contact with family has not been completed.     Has patient been referred to the Quitline?: Patient refused referral  CSW offered to complete Quitline referral with patient.  Patient declined referral at this time.   Patient has been referred for addiction treatment: Pt. refused referral  Harden Mo, LCSW 01/06/2022, 11:01 AM

## 2022-01-06 NOTE — Plan of Care (Signed)
PT denies SI/HI/AVH.  Pt expresses hopefulness that he will go home today and states that the MD said it was a possibility.  Pt is very concerned about getting his dogs back from the shelter.  Pt was isolative this shift; in room most of the time / sleeping with the exception of vitals and meals.  PT proactively refused nicotine patch before going to bed on 6/4 stating it makes his skin itch.

## 2022-01-06 NOTE — BH IP Treatment Plan (Signed)
Interdisciplinary Treatment and Diagnostic Plan Update  01/06/2022 Time of Session: 9:30 AM Brad Johns MRN: [id number]  Principal Diagnosis: Amphetamine and psychostimulant-induced psychosis with hallucinations (HCC)  Secondary Diagnoses: Principal Problem:   Yes changing meds.  Diagnosis Active Problems:   Amphetamine abuse (HCC)   Current Medications:  Current Facility-Administered Medications  Medication Dose Route Frequency Provider Last Rate Last Admin   acetaminophen (TYLENOL) tablet 650 mg  650 mg Oral Q6H PRN Clapacs, Jackquline Denmark, MD   650 mg at 01/04/22 0820   alum & mag hydroxide-simeth (MAALOX/MYLANTA) 200-200-20 MG/5ML suspension 30 mL  30 mL Oral Q4H PRN Clapacs, Jackquline Denmark, MD       haloperidol (HALDOL) tablet 2 mg  2 mg Oral BID Clapacs, Jackquline Denmark, MD   2 mg at 01/06/22 5397   hydrOXYzine (ATARAX) tablet 50 mg  50 mg Oral TID PRN Clapacs, Jackquline Denmark, MD       magnesium hydroxide (MILK OF MAGNESIA) suspension 30 mL  30 mL Oral Daily PRN Clapacs, [name], MD       nicotine (NICODERM CQ - dosed in mg/24 hours) patch 21 mg  21 mg Transdermal Q0600 Clapacs, [name], MD   21 mg at 01/04/22 0818   traZODone (DESYREL) tablet 100 mg  100 mg Oral QHS PRN Clapacs, Jackquline Denmark, MD       PTA Medications: No medications prior to admission.    Patient Stressors:    Patient Strengths:    Treatment Modalities: Medication Management, Group therapy, Case management,  1 to 1 session with clinician, Psychoeducation, Recreational therapy.   Physician Treatment Plan for Primary Diagnosis: Amphetamine and psychostimulant-induced psychosis with hallucinations (HCC) Long Term Goal(s):     Short Term Goals:    Medication Management: Evaluate patient's response, side effects, and tolerance of medication regimen.  Therapeutic Interventions: 1 to 1 sessions, Unit Group sessions and Medication administration.  Evaluation of Outcomes: Adequate for Discharge  Physician Treatment Plan for Secondary  Diagnosis: Principal Problem:   Yes changing meds.  Diagnosis Active Problems:   Amphetamine abuse (HCC)  Long Term Goal(s):     Short Term Goals:       Medication Management: Evaluate patient's response, side effects, and tolerance of medication regimen.  Therapeutic Interventions: 1 to 1 sessions, Unit Group sessions and Medication administration.  Evaluation of Outcomes: Adequate for Discharge   RN Treatment Plan for Primary Diagnosis: Amphetamine and psychostimulant-induced psychosis with hallucinations (HCC) Long Term Goal(s): Knowledge of disease and therapeutic regimen to maintain health will improve  Short Term Goals: Ability to remain free from injury will improve, Ability to verbalize frustration and anger appropriately will improve, Ability to demonstrate self-control, Ability to participate in decision making will improve, Ability to verbalize feelings will improve, Ability to disclose and discuss suicidal ideas, Ability to identify and develop effective coping behaviors will improve, and Compliance with prescribed medications will improve  Medication Management: RN will administer medications as ordered by provider, will assess and evaluate patient's response and provide education to patient for prescribed medication. RN will report any adverse and/or side effects to prescribing provider.  Therapeutic Interventions: 1 on 1 counseling sessions, Psychoeducation, Medication administration, Evaluate responses to treatment, Monitor vital signs and CBGs as ordered, Perform/monitor CIWA, COWS, AIMS and Fall Risk screenings as ordered, Perform wound care treatments as ordered.  Evaluation of Outcomes: Adequate for Discharge   LCSW Treatment Plan for Primary Diagnosis: Amphetamine and psychostimulant-induced psychosis with hallucinations (HCC) Long Term Goal(s): Safe transition to appropriate  next level of care at discharge, Engage patient in therapeutic group addressing  interpersonal concerns.  Short Term Goals: Engage patient in aftercare planning with referrals and resources, Increase social support, Increase ability to appropriately verbalize feelings, Increase emotional regulation, Facilitate acceptance of mental health diagnosis and concerns, Facilitate patient progression through stages of change regarding substance use diagnoses and concerns, Identify triggers associated with mental health/substance abuse issues, and Increase skills for wellness and recovery  Therapeutic Interventions: Assess for all discharge needs, 1 to 1 time with Social worker, Explore available resources and support systems, Assess for adequacy in community support network, Educate family and significant other(s) on suicide prevention, Complete Psychosocial Assessment, Interpersonal group therapy.  Evaluation of Outcomes: Adequate for Discharge   Progress in Treatment: Attending groups: No. Participating in groups: No. Taking medication as prescribed: Yes. Toleration medication: Yes. Family/Significant other contact made: No, will contact:  girlfriend, Brad Johns. Patient understands diagnosis: Yes. Discussing patient identified problems/goals with staff: Yes. Medical problems stabilized or resolved: Yes. Denies suicidal/homicidal ideation: Yes. Issues/concerns per patient self-inventory: No. Other: none.  New problem(s) identified: No, Describe:  none identified.  New Short Term/Long Term Goal(s): detox, elimination of symptoms of psychosis, medication management for mood stabilization; elimination of SI thoughts; development of comprehensive mental wellness/sobriety plan.  Patient Goals: "I thought I already had a discharge plan going."    Discharge Plan or Barriers: CSW will assist pt with development of an appropriate aftercare/discharge plan.  Reason for Continuation of Hospitalization: Medication stabilization Suicidal ideation Withdrawal symptoms  Estimated  Length of Stay: 1-7 days  Last 3 [place] Suicide Severity Risk Score: Flowsheet Row Admission (Current) from 01/03/2022 in [facility]IC[facility] ED from [facility]/02/2022 [facility] from 05/27/2021 in [facility]  C-SSRS RISK CATEGORY High Risk High Risk No Risk       Last PHQ 2/9 Scores:     View : No data to display.          Scribe for Treatment Team: Glenis Smoker, [place] 01/06/2022 10:46 AM

## 2022-01-06 NOTE — Plan of Care (Signed)
D: Pt alert and oriented. Pt rates depression 0/10, hopelessness 0/10, and anxiety 0/10. Pt reports energy level as normal and concentration as being good. Pt reports sleep last night as being fair. Pt did not receive medications for sleep. Pt denies experiencing any pain at this time. Pt denies experiencing any SI/HI, or AVH at this time.   Pt voices being eager to discharge. Pt states he's going to stay with a friend in the local area.  A: Scheduled medications administered to pt, per MD orders. Support and encouragement provided. Frequent verbal contact made. Routine safety checks conducted q15 minutes.   R: No adverse drug reactions noted. Pt verbally contracts for safety at this time. Pt compliant with medications. Pt interacts minimally with others on the unit. Pt remains safe at this time. Will continue to monitor.   Problem: Coping: Goal: Will verbalize feelings Outcome: Progressing   Problem: Activity: Goal: Interest or engagement in leisure activities will improve Outcome: Not Progressing

## 2022-01-06 NOTE — Progress Notes (Signed)
D: Pt alert and oriented. Pt denies experiencing any pain, SI/HI, or AVH at this time. Pt reports he will be able to keep himself safe when he returns home.   A: Pt received discharge and medication education/information. Pt belongings were returned and signed for at this time to include printed prescriptions, $100 dollar bill from the safe, and a medication supply.   R: Pt verbalized understanding of discharge and medication education/information.  Pt escorted by staff to medical mall front lobby where hospital services transported to the bus station.

## 2022-01-06 NOTE — Progress Notes (Signed)
Recreation Therapy Notes   Date: 01/06/2022   Time: 11:00 am    Location: Courtyard      Behavioral response: Appropriate   Intervention Topic: Leisure     Discussion/Intervention:  Group content today was focused on leisure. The group defined what leisure is and some positive leisure activities they participate in. Individuals identified the difference between good and bad leisure. Participants expressed how they feel after participating in the leisure of their choice. The group discussed how they go about picking a leisure activity and if others are involved in their leisure activities. The patient stated how many leisure activities they have to choose from and reasons why it is important to have leisure time. Individuals participated in the intervention "Exploration of Leisure" where they had a chance to identify new leisure activities as well as benefits of leisure. Clinical Observations/Feedback: Patient came to group and was able to explore participate in many leisure activities. Participant was able to identify leisure activities they enjoy outside of the hospital. Individual was social with peers and staff while participating in the intervention.  Danahi Reddish LRT/CTRS             Berton Butrick 01/06/2022 12:03 PM

## 2022-01-06 NOTE — [facility] Suicide Risk Assessment (Signed)
[facility] INPATIENT:  Family/Significant Other Suicide Prevention Education  Suicide Prevention Education:  Education Completed; [name], girlfriend, [phone],  has been identified by the patient as the family member/significant other with whom the patient will be residing, and identified as the person(s) who will aid the patient in the event of a mental health crisis (suicidal ideations/suicide attempt).  With written consent from the patient, the family member/significant other has been provided the following suicide prevention education, prior to the and/or following the discharge of the patient.  The suicide prevention education provided includes the following: Suicide risk factors Suicide prevention and interventions National Suicide Hotline telephone number [facility] assessment telephone number [facility] Emergency Assistance 911 [facility] and/or Residential Mobile Crisis Unit telephone number  Request made of family/significant other to: Remove weapons (e.g., guns, rifles, knives), all items previously/currently identified as safety concern.   Remove drugs/medications (over-the-counter, prescriptions, illicit drugs), all items previously/currently identified as a safety concern.  The family member/significant other verbalizes understanding of the suicide prevention education information provided.  The family member/significant other agrees to remove the items of safety concern listed above.  CSW spoke with the patient's identified partner.  CSW notes that patient's partner was somewhat aggressive in conversation as evidenced by self reports of being upset with previous calls to [facility] being unsuccessful "because the stupid twats keep asking for a password".  She reports that patient is admitted for "threatening to slit his throat right there in front of his probation officer".  She reports that the patient has a history of making suicidal statements.  She reports that  patient DOES have access to knives.  She reports that she is unable to remove knives.  She reports that patient is a danger to self "because he is constantly saying he is going to commit suicide and how he commits suicide can effect an innocent bystander.   Harden Mo 01/06/2022, 12:31 PM

## 2022-01-06 NOTE — [facility] Suicide Risk Assessment (Signed)
[facility] Discharge Suicide Risk Assessment   Principal Problem: Amphetamine and psychostimulant-induced psychosis with hallucinations [facility]) Discharge Diagnoses: Principal Problem:   Yes changing meds.  Diagnosis Active Problems:   Amphetamine abuse (HCC)   Total Time spent with patient: 30 minutes  Musculoskeletal: Strength & Muscle Tone: within normal limits Gait & Station: normal Patient leans: N/A  Psychiatric Specialty Exam  Presentation  General Appearance: No data recorded Eye Contact:No data recorded Speech:No data recorded Speech Volume:No data recorded Handedness:No data recorded  Mood and Affect  Mood:No data recorded Duration of Depression Symptoms: Less than two weeks  Affect:No data recorded  Thought Process  Thought Processes:No data recorded Descriptions of Associations:No data recorded Orientation:No data recorded Thought Content:No data recorded History of Schizophrenia/Schizoaffective disorder:No  Duration of Psychotic Symptoms:No data recorded Hallucinations:No data recorded Ideas of Reference:No data recorded Suicidal Thoughts:No data recorded Homicidal Thoughts:No data recorded  Sensorium  Memory:No data recorded Judgment:No data recorded Insight:No data recorded  Executive Functions  Concentration:No data recorded Attention Span:No data recorded Recall:No data recorded Fund of Knowledge:No data recorded Language:No data recorded  Psychomotor Activity  Psychomotor Activity:No data recorded  Assets  Assets:No data recorded  Sleep  Sleep:No data recorded  Physical Exam: Physical Exam Vitals and nursing note reviewed.  Constitutional:      Appearance: Normal appearance.  HENT:     Head: Normocephalic and atraumatic.     Mouth/Throat:     Pharynx: Oropharynx is clear.  Eyes:     Pupils: Pupils are equal, round, and reactive to light.  Cardiovascular:     Rate and Rhythm: Normal rate and regular rhythm.  Pulmonary:     Effort:  Pulmonary effort is normal.     Breath sounds: Normal breath sounds.  Abdominal:     General: Abdomen is flat.     Palpations: Abdomen is soft.  Musculoskeletal:        General: Normal range of motion.  Skin:    General: Skin is warm and dry.  Neurological:     General: No focal deficit present.     Mental Status: He is alert. Mental status is at baseline.  Psychiatric:        Attention and Perception: Attention normal.        Mood and Affect: Mood normal.        Speech: Speech normal.        Behavior: Behavior is cooperative.        Thought Content: Thought content normal.        Cognition and Memory: Memory is impaired.        Judgment: Judgment is impulsive.   Review of Systems  Constitutional: Negative.   HENT: Negative.    Eyes: Negative.   Respiratory: Negative.    Cardiovascular: Negative.   Gastrointestinal: Negative.   Musculoskeletal: Negative.   Skin: Negative.   Neurological: Negative.   Psychiatric/Behavioral: Negative.  Negative for depression.   Blood pressure 105/75, pulse 88, temperature 98.2 F (36.8 C), temperature source Oral, resp. rate 18, height 5\' 6"  (1.676 m), weight 73.5 kg, SpO2 97 %. Body mass index is 26.15 kg/m.  Mental Status Per Nursing Assessment::   On Admission:  Suicidal ideation indicated by patient  Demographic Factors:  Male, Caucasian, and Low socioeconomic status  Loss Factors: Financial problems/change in socioeconomic status  Historical Factors: Impulsivity  Risk Reduction Factors:   Living with another person, especially a relative and Positive therapeutic relationship  Continued Clinical Symptoms:  Depression:   Impulsivity Alcohol/Substance  Abuse/Dependencies  Cognitive Features That Contribute To Risk:  Thought constriction (tunnel vision)    Suicide Risk:  Minimal: No identifiable suicidal ideation.  Patients presenting with no risk factors but with morbid ruminations; may be classified as minimal risk based on  the severity of the depressive symptoms    Plan Of Care/Follow-up recommendations:  Patient will be referred to [facility] for outpatient treatment or whatever other local agency is appropriate.  Prescriptions provided with a supply of medicine and 30-day prescriptions.  Advised patient to stay away from drug abuse.  Patient at this point has upbeat affect denies suicidal ideation and not endorsing any psychotic symptoms and no longer meets commitment criteria.  Alethia Berthold, MD 01/06/2022, 10:28 AM

## 2022-01-31 NOTE — H&P (Signed)
Psychiatric Admission Assessment Adult  Patient Identification: Brad Johns MRN:  [id number] Date of Evaluation:  01/31/2022 Chief Complaint:  Schizophrenia (HCC) [F20.9] Principal Diagnosis: Amphetamine and psychostimulant-induced psychosis with hallucinations (HCC) Diagnosis:  Principal Problem:   Yes changing meds.  Diagnosis Active Problems:   Amphetamine abuse (HCC)  History of Present Illness: Patient presented to the emergency room with reports of suicidal ideation in the context of homelessness and mood instability and multiple losses Associated Signs/Symptoms: Depression Symptoms:  suicidal thoughts without plan, Duration of Depression Symptoms: Less than two weeks  (Hypo) Manic Symptoms:  Irritable Mood, Anxiety Symptoms:  Excessive Worry, Psychotic Symptoms:   None PTSD Symptoms: Negative Total Time spent with patient: 20 minutes  Past Psychiatric History: Past history of episodes of treatment for substance abuse and emergency room visits  Is the patient at risk to self? Yes.    Has the patient been a risk to self in the past 6 months? No.  Has the patient been a risk to self within the distant past? No.  Is the patient a risk to others? No.  Has the patient been a risk to others in the past 6 months? No.  Has the patient been a risk to others within the distant past? No.   Prior Inpatient Therapy:   Prior Outpatient Therapy:    Alcohol Screening: 1. How often do you have a drink containing alcohol?: Never 2. How many drinks containing alcohol do you have on a typical day when you are drinking?: 1 or 2 3. How often do you have six or more drinks on one occasion?: Never AUDIT-C Score: 0 4. How often during the last year have you found that you were not able to stop drinking once you had started?: Never 5. How often during the last year have you failed to do what was normally expected from you because of drinking?: Never 6. How often during the last year have you  needed a first drink in the morning to get yourself going after a heavy drinking session?: Never 7. How often during the last year have you had a feeling of guilt of remorse after drinking?: Never 8. How often during the last year have you been unable to remember what happened the night before because you had been drinking?: Never 9. Have you or someone else been injured as a result of your drinking?: No 10. Has a relative or friend or a doctor or another health worker been concerned about your drinking or suggested you cut down?: No Alcohol Use Disorder Identification Test Final Score (AUDIT): 0 Substance Abuse History in the last 12 months:  Yes.   Consequences of Substance Abuse:  Previous Psychotropic Medications: Yes  Psychological Evaluations: Yes  Past Medical History: History reviewed. No pertinent past medical history. History reviewed. No pertinent surgical history. Family History: History reviewed. No pertinent family history. Family Psychiatric  History: None Tobacco Screening:   Social History:  Social History   Substance and Sexual Activity  Alcohol Use Not Currently     Social History   Substance and Sexual Activity  Drug Use Not on file    Additional Social History: Marital status: Single Are you sexually active?: Yes Does patient have children?: Yes How many children?: 2 (Twin girls) How is patient's relationship with their children?: Patient reports his children's mother has full custody of his twins. Patient reports a strained relationship with his children due to children's mother not allowing him to see them.  Allergies:  No Known Allergies Lab Results: No results found for this or any previous visit (from the past 48 hour(s)).  Blood Alcohol level:  Lab Results  Component Value Date   ETH <10 01/02/2022    Metabolic Disorder Labs:  Lab Results  Component Value Date   HGBA1C 5.2 01/04/2022   MPG 102.54 01/04/2022    No results found for: "PROLACTIN" Lab Results  Component Value Date   CHOL 161 01/04/2022   TRIG 66 01/04/2022   HDL 47 01/04/2022   CHOLHDL 3.4 01/04/2022   VLDL 13 01/04/2022   LDLCALC 101 (H) 01/04/2022    Current Medications: No current facility-administered medications for this encounter.   Current Outpatient Medications  Medication Sig Dispense Refill   haloperidol (HALDOL) 2 MG tablet Take 1 tablet (2 mg total) by mouth 2 (two) times daily. 60 tablet 1   nicotine (NICODERM CQ - DOSED IN MG/24 HOURS) 21 mg/24hr patch Place 1 patch (21 mg total) onto the skin daily at 6 (six) AM. 28 patch 0   traZODone (DESYREL) 100 MG tablet Take 1 tablet (100 mg total) by mouth at bedtime as needed for sleep. 30 tablet 1   PTA Medications: No medications prior to admission.    Musculoskeletal: Strength & Muscle Tone: within normal limits Gait & Station: normal Patient leans: N/A            Psychiatric Specialty Exam:  Presentation  General Appearance: No data recorded Eye Contact:No data recorded Speech:No data recorded Speech Volume:No data recorded Handedness:No data recorded  Mood and Affect  Mood:No data recorded Affect:No data recorded  Thought Process  Thought Processes:No data recorded Duration of Psychotic Symptoms: No data recorded Past Diagnosis of Schizophrenia or Psychoactive disorder: No  Descriptions of Associations:No data recorded Orientation:No data recorded Thought Content:No data recorded Hallucinations:No data recorded Ideas of Reference:No data recorded Suicidal Thoughts:No data recorded Homicidal Thoughts:No data recorded  Sensorium  Memory:No data recorded Judgment:No data recorded Insight:No data recorded  Executive Functions  Concentration:No data recorded Attention Span:No data recorded Recall:No data recorded Fund of Knowledge:No data recorded Language:No data recorded  Psychomotor Activity  Psychomotor Activity:No  data recorded  Assets  Assets:No data recorded  Sleep  Sleep:No data recorded   Physical Exam: Physical Exam Constitutional:      Appearance: Normal appearance.  HENT:     Head: Normocephalic and atraumatic.     Mouth/Throat:     Pharynx: Oropharynx is clear.  Eyes:     Pupils: Pupils are equal, round, and reactive to light.  Cardiovascular:     Rate and Rhythm: Normal rate and regular rhythm.  Pulmonary:     Effort: Pulmonary effort is normal.     Breath sounds: Normal breath sounds.  Abdominal:     General: Abdomen is flat.     Palpations: Abdomen is soft.  Musculoskeletal:        General: Normal range of motion.  Skin:    General: Skin is warm and dry.  Neurological:     General: No focal deficit present.     Mental Status: He is alert. Mental status is at baseline.  Psychiatric:        Mood and Affect: Mood normal.        Thought Content: Thought content normal.    Review of Systems  Constitutional: Negative.   HENT: Negative.    Eyes: Negative.   Respiratory: Negative.    Cardiovascular: Negative.   Gastrointestinal: Negative.   Musculoskeletal: Negative.  Skin: Negative.   Neurological: Negative.    Blood pressure 105/75, pulse 88, temperature 98.2 F (36.8 C), temperature source Oral, resp. rate 18, height 5\' 6"  (1.676 m), weight 73.5 kg, SpO2 97 %. Body mass index is 26.15 kg/m.  Treatment Plan Summary: Plan admit to psychiatric unit for observation and 15-minute checks and therapy and assessment  Observation Level/Precautions:  15 minute checks  Laboratory:  Chemistry Profile  Psychotherapy:    Medications:    Consultations:    Discharge Concerns:    Estimated LOS:  Other:     Physician Treatment Plan for Primary Diagnosis: Amphetamine and psychostimulant-induced psychosis with hallucinations (HCC) Long Term Goal(s): Improvement in symptoms so as ready for discharge  Short Term Goals: Ability to demonstrate self-control will  improve  Physician Treatment Plan for Secondary Diagnosis: Principal Problem:   Yes changing meds.  Diagnosis Active Problems:   Amphetamine abuse (HCC)  Long Term Goal(s): Improvement in symptoms so as ready for discharge  Short Term Goals: Ability to disclose and discuss suicidal ideas  I certify that inpatient services furnished can reasonably be expected to improve the patient's condition.    , MD 6/30/20234:09 PM
